# Patient Record
Sex: Male | Born: 1985 | Race: Black or African American | Hispanic: No | Marital: Single | Smoking: Current every day smoker
Health system: Southern US, Community
[De-identification: ages and names within clinical notes are randomized; demographics above are authoritative.]

## PROBLEM LIST (undated history)

## (undated) DIAGNOSIS — M419 Scoliosis, unspecified: Secondary | ICD-10-CM

## (undated) DIAGNOSIS — W3400XA Accidental discharge from unspecified firearms or gun, initial encounter: Secondary | ICD-10-CM

---
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
## 2018-12-30 ENCOUNTER — Other Ambulatory Visit: Payer: Self-pay

## 2018-12-30 ENCOUNTER — Emergency Department (HOSPITAL_COMMUNITY)
Admission: EM | Admit: 2018-12-30 | Discharge: 2018-12-30 | Disposition: A | Discharge status: Home / Self Care | Payer: Self-pay | Attending: Emergency Medicine | Admitting: Emergency Medicine

## 2018-12-30 ENCOUNTER — Encounter (HOSPITAL_COMMUNITY): Payer: Self-pay

## 2018-12-30 DIAGNOSIS — S39012A Strain of muscle, fascia and tendon of lower back, initial encounter: Secondary | ICD-10-CM

## 2018-12-30 DIAGNOSIS — K2900 Acute gastritis without bleeding: Secondary | ICD-10-CM

## 2018-12-30 DIAGNOSIS — Y999 Unspecified external cause status: Secondary | ICD-10-CM | POA: Insufficient documentation

## 2018-12-30 DIAGNOSIS — Y929 Unspecified place or not applicable: Secondary | ICD-10-CM | POA: Insufficient documentation

## 2018-12-30 DIAGNOSIS — F17210 Nicotine dependence, cigarettes, uncomplicated: Secondary | ICD-10-CM | POA: Insufficient documentation

## 2018-12-30 DIAGNOSIS — X58XXXA Exposure to other specified factors, initial encounter: Secondary | ICD-10-CM | POA: Insufficient documentation

## 2018-12-30 DIAGNOSIS — Y939 Activity, unspecified: Secondary | ICD-10-CM | POA: Insufficient documentation

## 2018-12-30 DIAGNOSIS — R300 Dysuria: Secondary | ICD-10-CM | POA: Insufficient documentation

## 2018-12-30 HISTORY — DX: Scoliosis, unspecified: M41.9

## 2018-12-30 HISTORY — DX: Accidental discharge from unspecified firearms or gun, initial encounter: W34.00XA

## 2018-12-30 LAB — CBC WITH DIFFERENTIAL/PLATELET
Abs Immature Granulocytes: 0.01 10*3/uL (ref 0.00–0.07)
Basophils Absolute: 0 10*3/uL (ref 0.0–0.1)
Basophils Relative: 1 %
Eosinophils Absolute: 0.1 10*3/uL (ref 0.0–0.5)
Eosinophils Relative: 1 %
HEMATOCRIT: 50.1 % (ref 39.0–52.0)
Hemoglobin: 15.7 g/dL (ref 13.0–17.0)
Immature Granulocytes: 0 %
LYMPHS ABS: 1.2 10*3/uL (ref 0.7–4.0)
Lymphocytes Relative: 19 %
MCH: 22.3 pg — ABNORMAL LOW (ref 26.0–34.0)
MCHC: 31.3 g/dL (ref 30.0–36.0)
MCV: 71.1 fL — ABNORMAL LOW (ref 80.0–100.0)
Monocytes Absolute: 0.9 10*3/uL (ref 0.1–1.0)
Monocytes Relative: 14 %
Neutro Abs: 4.2 10*3/uL (ref 1.7–7.7)
Neutrophils Relative %: 65 %
Platelets: 295 10*3/uL (ref 150–400)
RBC: 7.05 MIL/uL — ABNORMAL HIGH (ref 4.22–5.81)
RDW: 16.4 % — ABNORMAL HIGH (ref 11.5–15.5)
WBC: 6.5 10*3/uL (ref 4.0–10.5)
nRBC: 0 % (ref 0.0–0.2)

## 2018-12-30 LAB — COMPREHENSIVE METABOLIC PANEL
ALBUMIN: 4 g/dL (ref 3.5–5.0)
ALK PHOS: 68 U/L (ref 38–126)
ALT: 16 U/L (ref 0–44)
AST: 28 U/L (ref 15–41)
Anion gap: 10 (ref 5–15)
BUN: 8 mg/dL (ref 6–20)
CO2: 25 mmol/L (ref 22–32)
Calcium: 9.4 mg/dL (ref 8.9–10.3)
Chloride: 102 mmol/L (ref 98–111)
Creatinine, Ser: 1.07 mg/dL (ref 0.61–1.24)
GFR calc Af Amer: >60 mL/min (ref >60)
GFR calc non Af Amer: >60 mL/min (ref >60)
Glucose, Bld: 97 mg/dL (ref 70–99)
Potassium: 3.8 mmol/L (ref 3.5–5.1)
Sodium: 137 mmol/L (ref 135–145)
Total Bilirubin: 0.6 mg/dL (ref 0.3–1.2)
Total Protein: 7.4 g/dL (ref 6.5–8.1)

## 2018-12-30 LAB — URINALYSIS, ROUTINE W REFLEX MICROSCOPIC
Bilirubin Urine: NEGATIVE
Glucose, UA: NEGATIVE mg/dL
Hgb urine dipstick: NEGATIVE
KETONES UR: NEGATIVE mg/dL
Leukocytes,Ua: NEGATIVE
Nitrite: NEGATIVE
Protein, ur: NEGATIVE mg/dL
Specific Gravity, Urine: 1.02 (ref 1.005–1.030)
pH: 7 (ref 5.0–8.0)

## 2018-12-30 LAB — ACETAMINOPHEN LEVEL: Acetaminophen (Tylenol), Serum: <10 ug/mL — ABNORMAL LOW (ref 10–30)

## 2018-12-30 LAB — LIPASE, BLOOD: Lipase: 28 U/L (ref 11–51)

## 2018-12-30 MED ORDER — FAMOTIDINE IN NACL 20-0.9 MG/50ML-% IV SOLN
20.0000 mg | Freq: Once | INTRAVENOUS | Status: AC
  Administered 2018-12-30: 20 mg via INTRAVENOUS
  Filled 2018-12-30: qty 50

## 2018-12-30 MED ORDER — LIDOCAINE VISCOUS HCL 2 % MT SOLN
15.0000 mL | Freq: Once | OROMUCOSAL | Status: AC
  Administered 2018-12-30: 15 mL via ORAL
  Filled 2018-12-30: qty 15

## 2018-12-30 MED ORDER — CYCLOBENZAPRINE HCL 10 MG PO TABS: 10.0000 mg | ORAL_TABLET | Freq: Two times a day (BID) | ORAL | 0 refills | Status: DC | PRN

## 2018-12-30 MED ORDER — ALUM & MAG HYDROXIDE-SIMETH 200-200-20 MG/5ML PO SUSP
30.0000 mL | Freq: Once | ORAL | Status: AC
  Administered 2018-12-30: 30 mL via ORAL
  Filled 2018-12-30: qty 30

## 2018-12-30 MED ORDER — OMEPRAZOLE 20 MG PO CPDR: 20.0000 mg | DELAYED_RELEASE_CAPSULE | Freq: Two times a day (BID) | ORAL | 0 refills | Status: AC

## 2018-12-30 MED ORDER — IBUPROFEN 600 MG PO TABS: 600.0000 mg | ORAL_TABLET | Freq: Four times a day (QID) | ORAL | 0 refills | Status: AC | PRN
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
## 2018-12-30 NOTE — Discharge Instructions (Addendum)
Do not take naproxen for the next three days. You may start the ibuprofen in three days. Never take more acetaminophen than what is needed on the label. You can take the Flexeril as needed for muscle spasms. Start taking the omeprazole today for your stomach pain.

## 2018-12-30 NOTE — ED Notes (Signed)
ED Provider at bedside. 

## 2018-12-31 LAB — RPR: RPR Ser Ql: NONREACTIVE

## 2018-12-31 LAB — HIV ANTIBODY (ROUTINE TESTING W REFLEX): HIV Screen 4th Generation wRfx: NONREACTIVE

## 2019-01-01 ENCOUNTER — Other Ambulatory Visit: Payer: Self-pay

## 2019-01-01 ENCOUNTER — Emergency Department (HOSPITAL_COMMUNITY): Payer: Self-pay

## 2019-01-01 ENCOUNTER — Encounter (HOSPITAL_COMMUNITY): Payer: Self-pay | Admitting: Emergency Medicine

## 2019-01-01 ENCOUNTER — Emergency Department (HOSPITAL_COMMUNITY)
Admission: EM | Admit: 2019-01-01 | Discharge: 2019-01-01 | Disposition: A | Discharge status: Home / Self Care | Payer: Self-pay | Attending: Emergency Medicine | Admitting: Emergency Medicine

## 2019-01-01 DIAGNOSIS — R109 Unspecified abdominal pain: Secondary | ICD-10-CM

## 2019-01-01 DIAGNOSIS — Z79899 Other long term (current) drug therapy: Secondary | ICD-10-CM | POA: Insufficient documentation

## 2019-01-01 DIAGNOSIS — R197 Diarrhea, unspecified: Secondary | ICD-10-CM | POA: Insufficient documentation

## 2019-01-01 DIAGNOSIS — K920 Hematemesis: Secondary | ICD-10-CM | POA: Insufficient documentation

## 2019-01-01 DIAGNOSIS — F17210 Nicotine dependence, cigarettes, uncomplicated: Secondary | ICD-10-CM | POA: Insufficient documentation

## 2019-01-01 DIAGNOSIS — R1011 Right upper quadrant pain: Secondary | ICD-10-CM | POA: Insufficient documentation

## 2019-01-01 LAB — COMPREHENSIVE METABOLIC PANEL
ALT: 14 U/L (ref 0–44)
ANION GAP: 6 (ref 5–15)
AST: 24 U/L (ref 15–41)
Albumin: 3.6 g/dL (ref 3.5–5.0)
Alkaline Phosphatase: 60 U/L (ref 38–126)
BUN: 9 mg/dL (ref 6–20)
CO2: 27 mmol/L (ref 22–32)
Calcium: 9 mg/dL (ref 8.9–10.3)
Chloride: 104 mmol/L (ref 98–111)
Creatinine, Ser: 1.14 mg/dL (ref 0.61–1.24)
GFR calc Af Amer: >60 mL/min (ref >60)
GFR calc non Af Amer: >60 mL/min (ref >60)
Glucose, Bld: 95 mg/dL (ref 70–99)
Potassium: 3.7 mmol/L (ref 3.5–5.1)
Sodium: 137 mmol/L (ref 135–145)
Total Bilirubin: 0.6 mg/dL (ref 0.3–1.2)
Total Protein: 7.4 g/dL (ref 6.5–8.1)

## 2019-01-01 LAB — CBC
HCT: 47 % (ref 39.0–52.0)
Hemoglobin: 14.5 g/dL (ref 13.0–17.0)
MCH: 22.3 pg — ABNORMAL LOW (ref 26.0–34.0)
MCHC: 30.9 g/dL (ref 30.0–36.0)
MCV: 72.3 fL — ABNORMAL LOW (ref 80.0–100.0)
Platelets: 304 10*3/uL (ref 150–400)
RBC: 6.5 MIL/uL — ABNORMAL HIGH (ref 4.22–5.81)
RDW: 15.5 % (ref 11.5–15.5)
WBC: 5.6 10*3/uL (ref 4.0–10.5)
nRBC: 0 % (ref 0.0–0.2)

## 2019-01-01 LAB — URINALYSIS, ROUTINE W REFLEX MICROSCOPIC
Bilirubin Urine: NEGATIVE
Glucose, UA: NEGATIVE mg/dL
Hgb urine dipstick: NEGATIVE
Ketones, ur: NEGATIVE mg/dL
Leukocytes,Ua: NEGATIVE
NITRITE: NEGATIVE
Protein, ur: NEGATIVE mg/dL
Specific Gravity, Urine: 1.026 (ref 1.005–1.030)
pH: 6 (ref 5.0–8.0)

## 2019-01-01 LAB — LIPASE, BLOOD: Lipase: 25 U/L (ref 11–51)

## 2019-01-01 MED ORDER — SODIUM CHLORIDE 0.9 % IV SOLN
1000.0000 mL | INTRAVENOUS | Status: DC
  Administered 2019-01-01: 1000 mL via INTRAVENOUS

## 2019-01-01 MED ORDER — HYDROMORPHONE HCL 1 MG/ML IJ SOLN
1.0000 mg | Freq: Once | INTRAMUSCULAR | Status: AC
  Administered 2019-01-01: 1 mg via INTRAVENOUS
  Filled 2019-01-01: qty 1

## 2019-01-01 MED ORDER — ONDANSETRON 8 MG PO TBDP: 8.0000 mg | ORAL_TABLET | Freq: Three times a day (TID) | ORAL | 0 refills | Status: AC | PRN

## 2019-01-01 MED ORDER — IOHEXOL 300 MG/ML  SOLN
100.0000 mL | Freq: Once | INTRAMUSCULAR | Status: AC | PRN
  Administered 2019-01-01: 100 mL via INTRAVENOUS

## 2019-01-01 MED ORDER — FAMOTIDINE 20 MG PO TABS: 20.0000 mg | ORAL_TABLET | Freq: Two times a day (BID) | ORAL | 0 refills | Status: AC

## 2019-01-01 MED ORDER — SODIUM CHLORIDE 0.9% FLUSH
3.0000 mL | Freq: Once | INTRAVENOUS | Status: DC
  Administered 2019-01-01: 3 mL via INTRAVENOUS

## 2019-01-01 MED ORDER — SODIUM CHLORIDE 0.9 % IV BOLUS (SEPSIS)
1000.0000 mL | Freq: Once | INTRAVENOUS | Status: AC
  Administered 2019-01-01: 1000 mL via INTRAVENOUS

## 2019-01-01 MED ORDER — ONDANSETRON HCL 4 MG/2ML IJ SOLN
4.0000 mg | Freq: Once | INTRAMUSCULAR | Status: AC
  Administered 2019-01-01: 4 mg via INTRAVENOUS
  Filled 2019-01-01: qty 2

## 2019-01-01 MED ORDER — DICYCLOMINE HCL 20 MG PO TABS: 20.0000 mg | ORAL_TABLET | Freq: Two times a day (BID) | ORAL | 0 refills | Status: AC

## 2019-01-01 MED ORDER — SODIUM CHLORIDE 0.9 % IV BOLUS (SEPSIS): 1000.0000 mL | Freq: Once | INTRAVENOUS | Status: DC

## 2019-01-01 NOTE — ED Notes (Signed)
Patient verbalizes understanding of discharge instructions . Opportunity for questions and answers were provided . Armband removed by staff ,Pt discharged from ED. W/C  offered at D/C  and Declined W/C at D/C and was escorted to lobby by RN.  
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
## 2019-01-01 NOTE — Discharge Instructions (Addendum)
Take the medications as prescribed, follow-up with your primary care doctor to be rechecked next week, return as needed for worsening symptoms

## 2019-01-01 NOTE — ED Triage Notes (Signed)
Pt c/o abdominal pain with nausea/vomitng and diarrhea. Pt reports taking ibuprofen and "6 pills for pain that I got from [facility]" with no relief.

## 2019-01-02 LAB — GC/CHLAMYDIA PROBE AMP (~~LOC~~) NOT AT ARMC
Chlamydia: NEGATIVE
Neisseria Gonorrhea: NEGATIVE

## 2019-05-25 ENCOUNTER — Encounter (HOSPITAL_COMMUNITY): Payer: Self-pay

## 2019-05-25 ENCOUNTER — Other Ambulatory Visit: Payer: Self-pay

## 2019-05-25 ENCOUNTER — Emergency Department (HOSPITAL_COMMUNITY): Payer: Self-pay

## 2019-05-25 ENCOUNTER — Emergency Department (HOSPITAL_COMMUNITY)
Admission: EM | Admit: 2019-05-25 | Discharge: 2019-05-25 | Disposition: A | Discharge status: Home / Self Care | Payer: Self-pay | Attending: Emergency Medicine | Admitting: Emergency Medicine

## 2019-05-25 DIAGNOSIS — F17210 Nicotine dependence, cigarettes, uncomplicated: Secondary | ICD-10-CM | POA: Insufficient documentation

## 2019-05-25 DIAGNOSIS — R202 Paresthesia of skin: Secondary | ICD-10-CM | POA: Insufficient documentation

## 2019-05-25 DIAGNOSIS — Z79899 Other long term (current) drug therapy: Secondary | ICD-10-CM | POA: Insufficient documentation

## 2019-05-25 MED ORDER — OXYCODONE-ACETAMINOPHEN 5-325 MG PO TABS
1.0000 | ORAL_TABLET | ORAL | Status: DC | PRN
  Administered 2019-05-25: 1 via ORAL
  Filled 2019-05-25: qty 1

## 2019-05-25 MED ORDER — KETOROLAC TROMETHAMINE 60 MG/2ML IM SOLN
15.0000 mg | Freq: Once | INTRAMUSCULAR | Status: AC
  Administered 2019-05-25: 15 mg via INTRAMUSCULAR
  Filled 2019-05-25: qty 2

## 2019-05-25 MED ORDER — OXYCODONE HCL 5 MG PO TABS
5.0000 mg | ORAL_TABLET | Freq: Once | ORAL | Status: AC
  Administered 2019-05-25: 5 mg via ORAL
  Filled 2019-05-25: qty 1
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
## 2019-05-25 NOTE — ED Triage Notes (Signed)
Patient reports that he is "getting like a shock in his left leg today" Patient states he also has stinging and burning in the left leg. Patient denies any injury to the left leg.

## 2019-05-25 NOTE — Discharge Instructions (Signed)
There was no issue of your hardware or bones on your x-ray.  Please follow-up with the family doctor and a neurologist.  Take 4 over the counter ibuprofen tablets 3 times a day or 2 over-the-counter naproxen tablets twice a day for pain. Also take tylenol 1000mg (2 extra strength) four times a day.

## 2019-05-26 ENCOUNTER — Telehealth: Payer: Self-pay | Admitting: General Practice

## 2019-05-26 NOTE — Telephone Encounter (Signed)
Tried to call pt based on CRM we received, per note: pt would like appt to establish care, also diarrhea issue and leg issue. Pt wife said she would have him call back

## 2020-02-15 ENCOUNTER — Telehealth: Payer: Self-pay | Admitting: *Deleted

## 2020-02-15 ENCOUNTER — Ambulatory Visit: Payer: Self-pay | Attending: Internal Medicine

## 2020-02-15 DIAGNOSIS — Z23 Encounter for immunization: Secondary | ICD-10-CM

## 2020-02-15 NOTE — Progress Notes (Signed)
   Covid-19 Vaccination Clinic  Name:  Terry Evans    MRN: [id number] DOB: Jul 01, 1986  02/15/2020  [name] was observed post Covid-19 immunization for 15 minutes without incident. He was provided with Vaccine Information Sheet and instruction to access the V-Safe system.   [name] was instructed to call 911 with any severe reactions post vaccine: Marland Kitchen Difficulty breathing  . Swelling of face and throat  . A fast heartbeat  . A bad rash all over body  . Dizziness and weakness   Immunizations Administered    Name Date Dose VIS Date Route   Pfizer COVID-19 Vaccine 02/15/2020 12:03 PM 0.3 mL 12/20/2018 Intramuscular   Manufacturer: [employer], [employer]   Lot: [id number]   NDC: [address]-8

## 2020-02-15 NOTE — Telephone Encounter (Signed)
Patient calling for general information about the COVID vaccine: SE, how it works, what happens if I get COVID after- questions answered to patient satisfaction

## 2020-03-11 ENCOUNTER — Ambulatory Visit: Payer: Self-pay | Attending: Internal Medicine

## 2020-03-11 DIAGNOSIS — Z23 Encounter for immunization: Secondary | ICD-10-CM

## 2020-03-11 NOTE — Progress Notes (Signed)
   Covid-19 Vaccination Clinic  Name:  Terry Evans    MRN: [id number] DOB: [birth year]  03/11/2020  Mr. Groleau was observed post Covid-19 immunization for 15 minutes without incident. He was provided with Vaccine Information Sheet and instruction to access the V-Safe system.   Mr. Langworthy was instructed to call 911 with any severe reactions post vaccine: Marland Kitchen Difficulty breathing  . Swelling of face and throat  . A fast heartbeat  . A bad rash all over body  . Dizziness and weakness   Immunizations Administered    Name Date Dose VIS Date Route   Pfizer COVID-19 Vaccine 03/11/2020 11:46 AM 0.3 mL 12/20/2018 Intramuscular   Manufacturer: [employer], [employer]   Lot: [id number]   NDC: [address]-4

## 2020-03-16 ENCOUNTER — Emergency Department (HOSPITAL_COMMUNITY)
Admission: EM | Admit: 2020-03-16 | Discharge: 2020-03-16 | Disposition: A | Discharge status: Home / Self Care | Payer: Self-pay | Attending: Emergency Medicine | Admitting: Emergency Medicine

## 2020-03-16 ENCOUNTER — Emergency Department (HOSPITAL_COMMUNITY): Payer: Self-pay

## 2020-03-16 ENCOUNTER — Other Ambulatory Visit: Payer: Self-pay

## 2020-03-16 DIAGNOSIS — F17210 Nicotine dependence, cigarettes, uncomplicated: Secondary | ICD-10-CM | POA: Insufficient documentation

## 2020-03-16 DIAGNOSIS — M25531 Pain in right wrist: Secondary | ICD-10-CM | POA: Insufficient documentation

## 2020-03-16 MED ORDER — IBUPROFEN 200 MG PO TABS
600.0000 mg | ORAL_TABLET | Freq: Once | ORAL | Status: AC
  Administered 2020-03-16: 600 mg via ORAL
  Filled 2020-03-16: qty 3

## 2020-03-16 NOTE — ED Triage Notes (Signed)
Patient reports swelling in anterior right wrist. Started yesterday. Denies injury. States he cannot move wrist. Pain is 3/10
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
## 2020-03-16 NOTE — Discharge Instructions (Addendum)

## 2020-04-06 ENCOUNTER — Other Ambulatory Visit: Payer: Self-pay

## 2020-04-06 ENCOUNTER — Emergency Department (HOSPITAL_COMMUNITY)
Admission: EM | Admit: 2020-04-06 | Discharge: 2020-04-06 | Disposition: A | Discharge status: Home / Self Care | Payer: No Typology Code available for payment source | Attending: Emergency Medicine | Admitting: Emergency Medicine

## 2020-04-06 ENCOUNTER — Encounter (HOSPITAL_COMMUNITY): Payer: Self-pay

## 2020-04-06 DIAGNOSIS — X58XXXA Exposure to other specified factors, initial encounter: Secondary | ICD-10-CM | POA: Insufficient documentation

## 2020-04-06 DIAGNOSIS — Z79899 Other long term (current) drug therapy: Secondary | ICD-10-CM | POA: Diagnosis not present

## 2020-04-06 DIAGNOSIS — Y999 Unspecified external cause status: Secondary | ICD-10-CM | POA: Diagnosis not present

## 2020-04-06 DIAGNOSIS — Y9389 Activity, other specified: Secondary | ICD-10-CM | POA: Diagnosis not present

## 2020-04-06 DIAGNOSIS — S61214A Laceration without foreign body of right ring finger without damage to nail, initial encounter: Secondary | ICD-10-CM | POA: Insufficient documentation

## 2020-04-06 DIAGNOSIS — Z23 Encounter for immunization: Secondary | ICD-10-CM | POA: Diagnosis not present

## 2020-04-06 DIAGNOSIS — Y9269 Other specified industrial and construction area as the place of occurrence of the external cause: Secondary | ICD-10-CM | POA: Insufficient documentation

## 2020-04-06 DIAGNOSIS — F17210 Nicotine dependence, cigarettes, uncomplicated: Secondary | ICD-10-CM | POA: Insufficient documentation

## 2020-04-06 MED ORDER — CEPHALEXIN 500 MG PO CAPS: 500.0000 mg | ORAL_CAPSULE | Freq: Three times a day (TID) | ORAL | 0 refills | Status: AC

## 2020-04-06 MED ORDER — TETANUS-DIPHTH-ACELL PERTUSSIS 5-2.5-18.5 LF-MCG/0.5 IM SUSP
0.5000 mL | Freq: Once | INTRAMUSCULAR | Status: AC
  Administered 2020-04-06: 0.5 mL via INTRAMUSCULAR
  Filled 2020-04-06: qty 0.5

## 2020-04-06 MED ORDER — KETOROLAC TROMETHAMINE 30 MG/ML IJ SOLN
30.0000 mg | Freq: Once | INTRAMUSCULAR | Status: AC
  Administered 2020-04-06: 30 mg via INTRAMUSCULAR
  Filled 2020-04-06: qty 1

## 2020-04-06 NOTE — ED Notes (Signed)
Cleansed laceration with saline and betadine.

## 2020-04-06 NOTE — Discharge Instructions (Addendum)
WOUND CARE  Keep area clean and dry for 24 hours. Do not remove bandage, if applied. Take antibiotics as directed, please come back to the emergency department for reactions to these.  Take on a full stomach.  After 24 hours, remove bandage and wash wound gently with mild soap and warm water. Reapply a new bandage after cleaning wound, if directed.  Continue daily cleansing with soap and water until stitches/staples are removed.  Do not apply any ointments or creams to the wound while stitches/staples are in place, as this may cause delayed healing.  Notify the office if you experience any of the following signs of infection: Swelling, redness, pus drainage, streaking, fever >101.0 F  Notify the office if you experience excessive bleeding that does not stop after 15-20 minutes of constant, firm pressure.
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
## 2020-04-06 NOTE — ED Triage Notes (Signed)
Patient c/o right index finger laceration that happened yesterday. 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 Fright index finger still having  A small amount bleeding at this time.

## 2020-04-07 ENCOUNTER — Encounter (HOSPITAL_COMMUNITY): Payer: Self-pay | Admitting: Emergency Medicine

## 2020-04-07 ENCOUNTER — Emergency Department (HOSPITAL_COMMUNITY)
Admission: EM | Admit: 2020-04-07 | Discharge: 2020-04-07 | Disposition: A | Discharge status: Home / Self Care | Payer: Self-pay | Attending: Emergency Medicine | Admitting: Emergency Medicine

## 2020-04-07 DIAGNOSIS — Z760 Encounter for issue of repeat prescription: Secondary | ICD-10-CM | POA: Insufficient documentation

## 2020-04-07 DIAGNOSIS — S61214D Laceration without foreign body of right ring finger without damage to nail, subsequent encounter: Secondary | ICD-10-CM | POA: Insufficient documentation

## 2020-04-07 DIAGNOSIS — Z5321 Procedure and treatment not carried out due to patient leaving prior to being seen by health care provider: Secondary | ICD-10-CM | POA: Insufficient documentation

## 2020-04-07 DIAGNOSIS — X58XXXD Exposure to other specified factors, subsequent encounter: Secondary | ICD-10-CM | POA: Insufficient documentation

## 2020-04-07 NOTE — ED Triage Notes (Signed)
Pt reports he was here yesterday for a laceration on right 4th finger. Reports he was given a prescription for antibiotics but denies given any prescription for pain and finger in severe pain.

## 2020-05-27 ENCOUNTER — Encounter (HOSPITAL_COMMUNITY): Payer: Self-pay

## 2020-05-27 ENCOUNTER — Emergency Department (HOSPITAL_COMMUNITY): Payer: Self-pay

## 2020-05-27 ENCOUNTER — Emergency Department (HOSPITAL_COMMUNITY)
Admission: EM | Admit: 2020-05-27 | Discharge: 2020-05-27 | Disposition: A | Discharge status: Home / Self Care | Payer: Self-pay | Attending: Emergency Medicine | Admitting: Emergency Medicine

## 2020-05-27 ENCOUNTER — Other Ambulatory Visit: Payer: Self-pay

## 2020-05-27 DIAGNOSIS — B349 Viral infection, unspecified: Secondary | ICD-10-CM | POA: Insufficient documentation

## 2020-05-27 DIAGNOSIS — R067 Sneezing: Secondary | ICD-10-CM | POA: Insufficient documentation

## 2020-05-27 DIAGNOSIS — F17210 Nicotine dependence, cigarettes, uncomplicated: Secondary | ICD-10-CM | POA: Insufficient documentation

## 2020-05-27 DIAGNOSIS — M791 Myalgia, unspecified site: Secondary | ICD-10-CM | POA: Insufficient documentation

## 2020-05-27 DIAGNOSIS — R05 Cough: Secondary | ICD-10-CM | POA: Insufficient documentation

## 2020-05-27 MED ORDER — IBUPROFEN 200 MG PO TABS
400.0000 mg | ORAL_TABLET | Freq: Once | ORAL | Status: AC
  Administered 2020-05-27: 400 mg via ORAL
  Filled 2020-05-27: qty 2

## 2020-05-27 NOTE — ED Notes (Signed)
Multiple attempts made to swab the patient for Covid 19. Patient did not tolerate well. Patient pulled swab away from his nose, swung his arms hitting staff and slid down in the seat away from the swab.

## 2020-05-27 NOTE — Discharge Instructions (Addendum)
Take Motrin or Tylenol as needed for pain or fever.  Use Robitussin-DM if needed for cough.  Follow-up with your doctor if you are not better in 4 to 5 days.
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
## 2020-05-27 NOTE — ED Triage Notes (Signed)
Patient states he was exposed to Covid a week ago. Patient states he has body aches, heart burn, and a congestion x 1 week.

## 2020-11-04 IMAGING — CR DG CHEST 2V
2 series · 2 of 2 positions shown · non-contrast
Comparison: January 01, 2019

CLINICAL DATA: Congestion, COVID exposure

EXAM:
CHEST - 2 VIEW

[w chest pa]
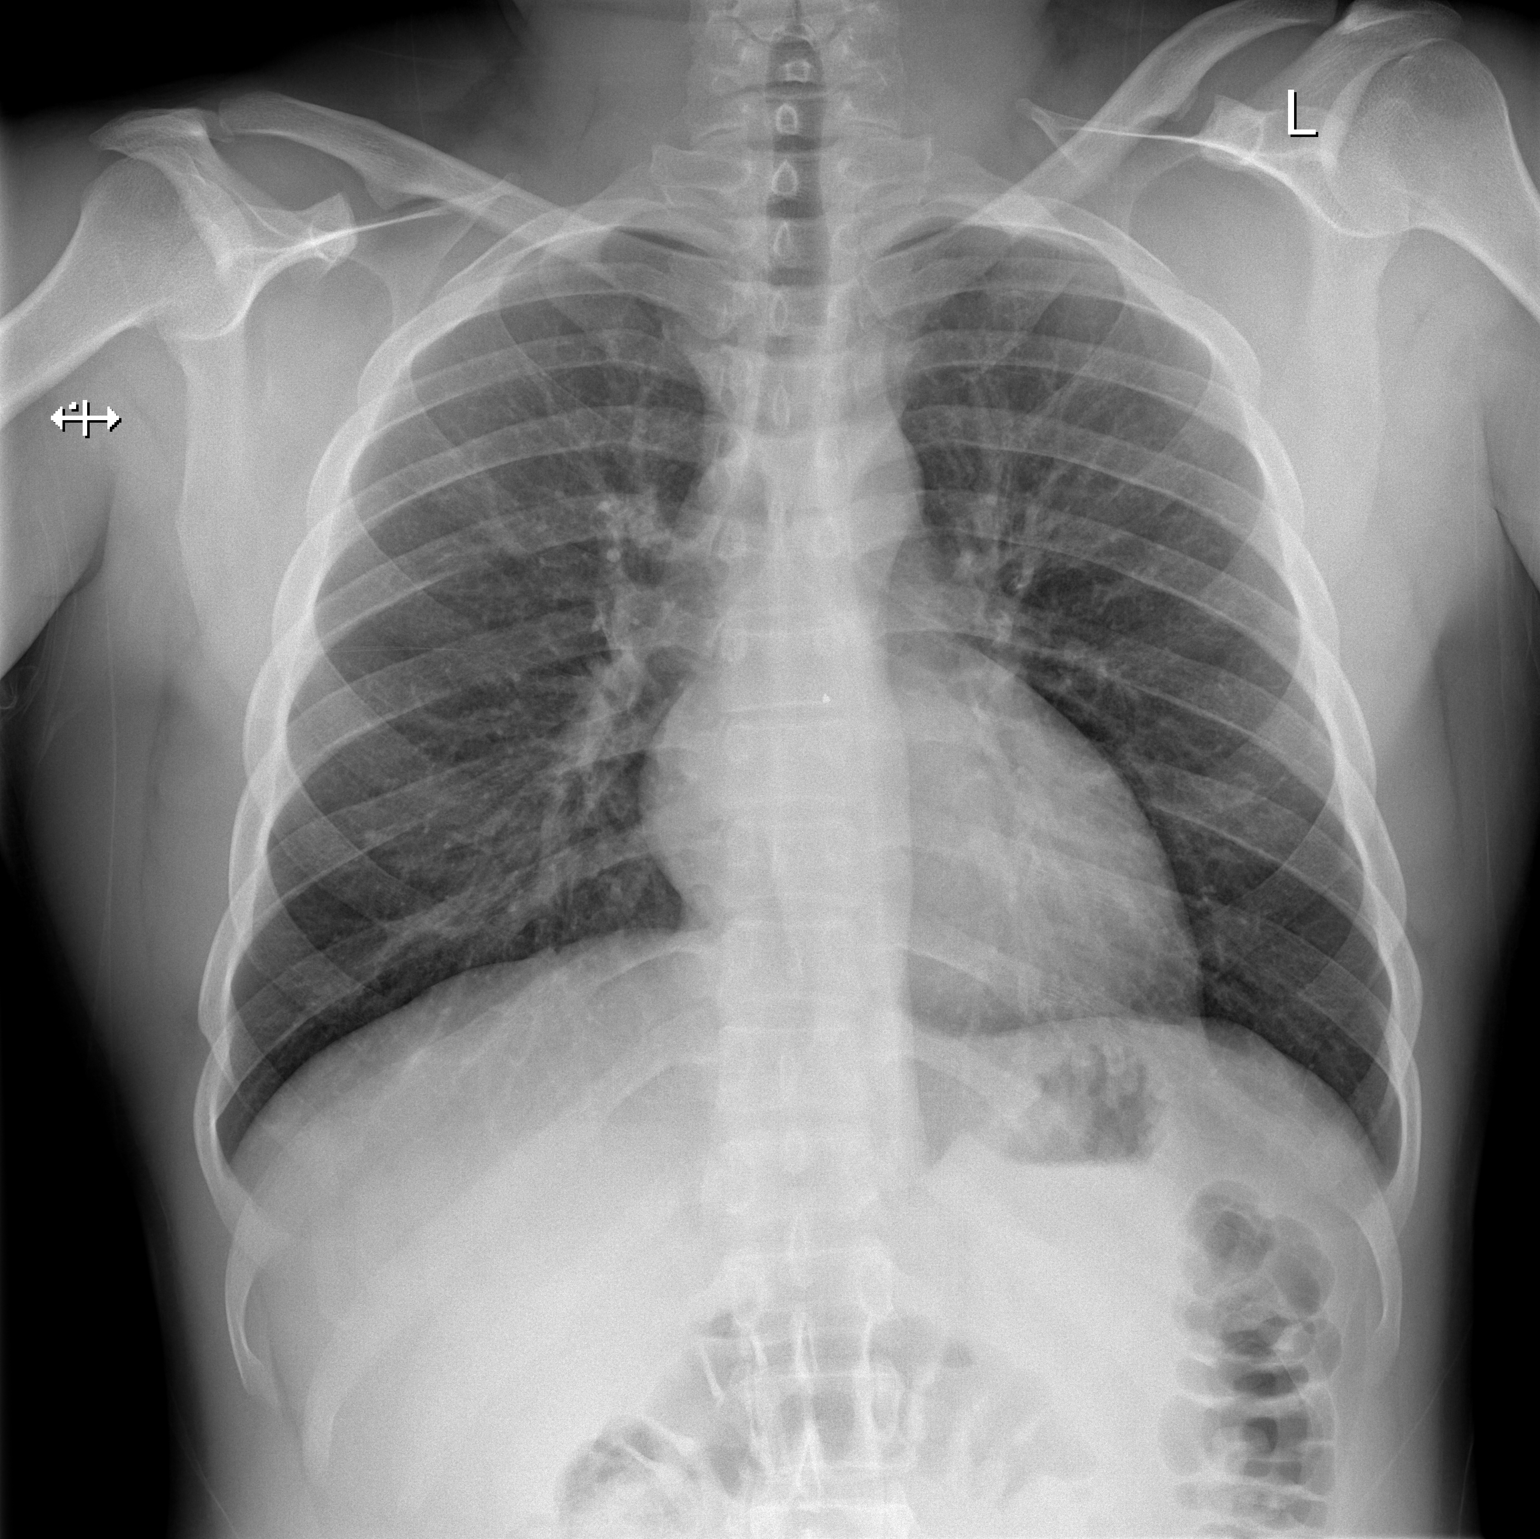

[w chest lat]
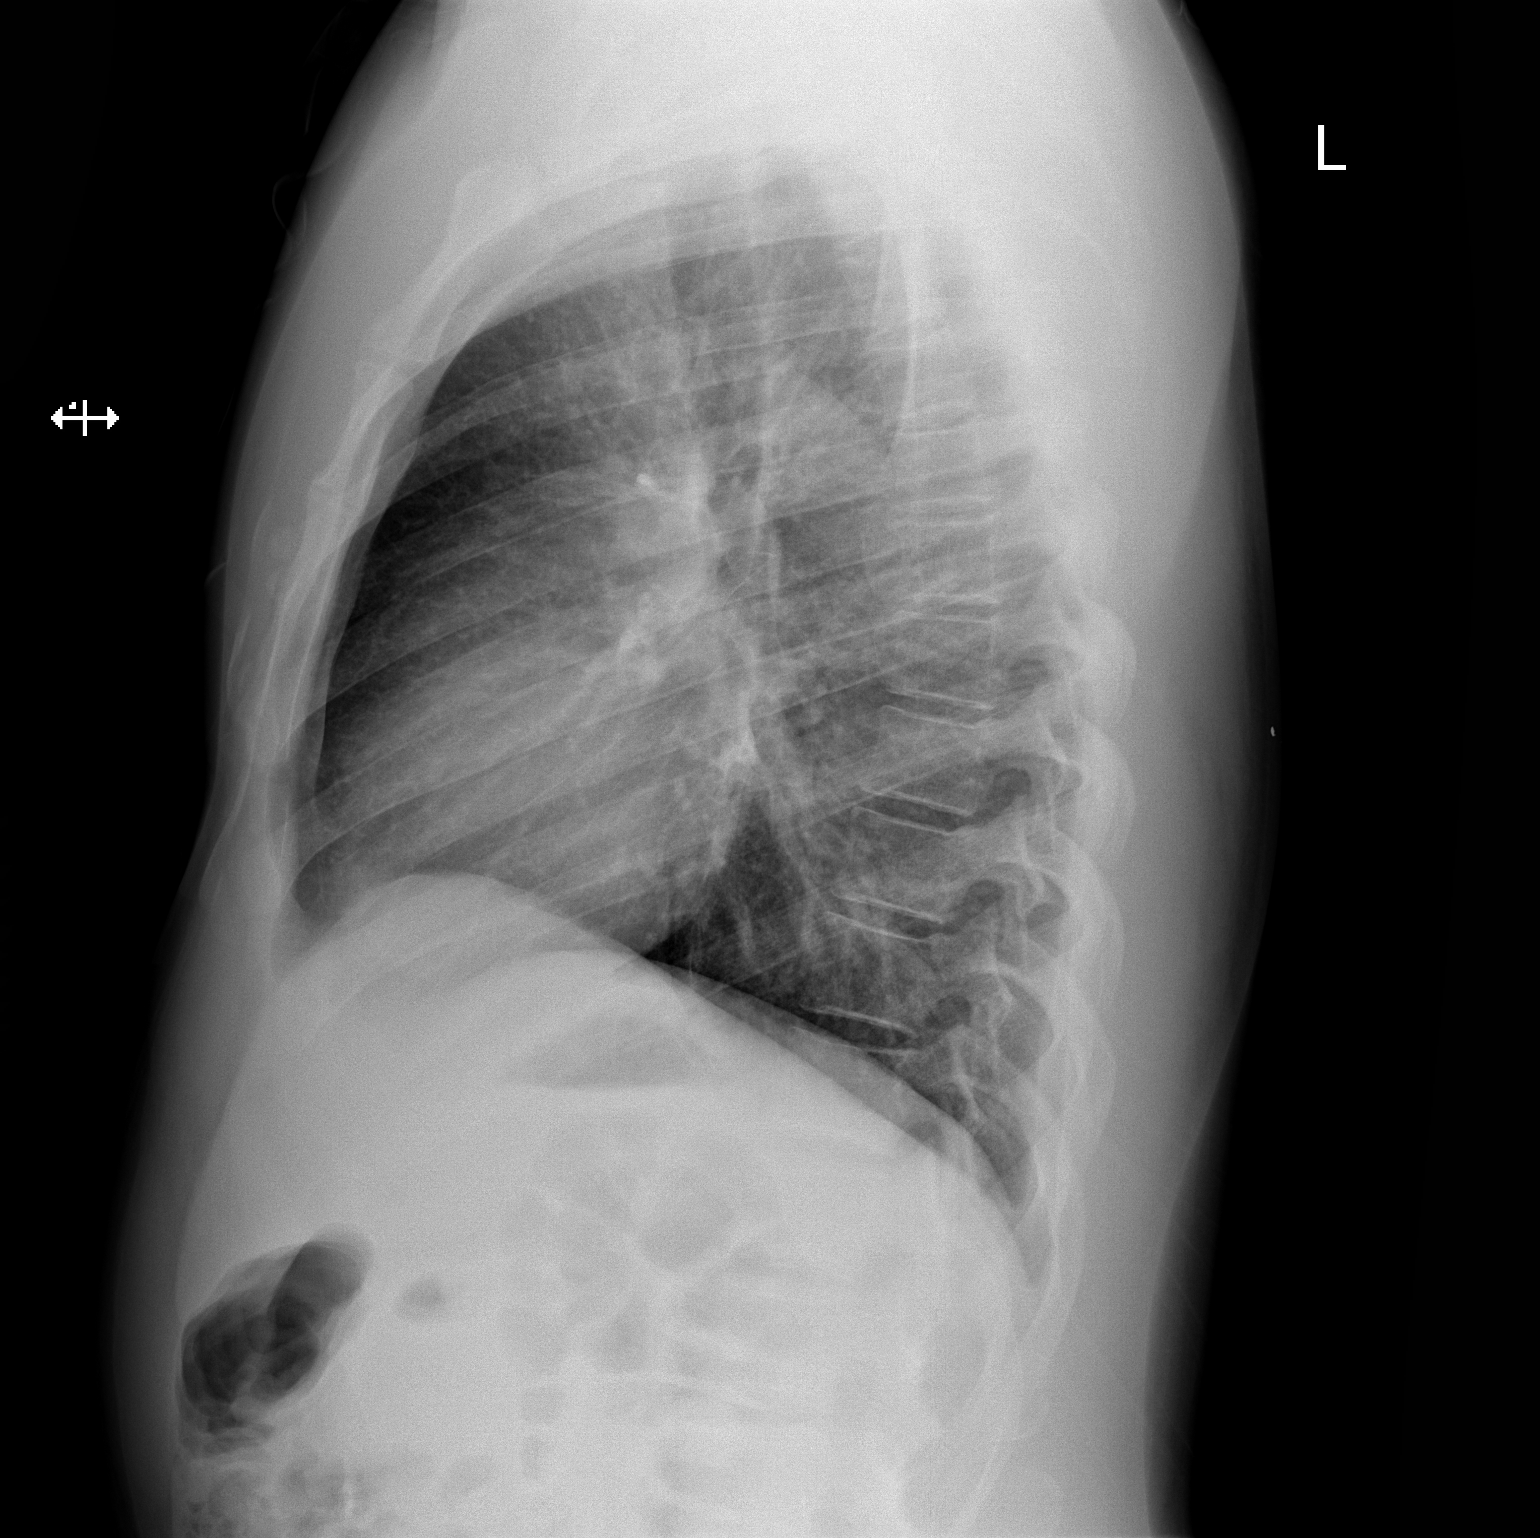

[2 of 2 positions shown; findings below may reference images not displayed]

FINDINGS: The cardiomediastinal silhouette is normal in contour. No pleural
effusion. No pneumothorax. No acute pleuroparenchymal abnormality.
Visualized abdomen is unremarkable. No acute osseous abnormality
noted. Unchanged metallic fleck of the posterior back superficial
soft tissues overlying the spine consistent with a retained foreign
body.
IMPRESSION: No acute cardiopulmonary abnormality.

## 2022-07-27 ENCOUNTER — Emergency Department (HOSPITAL_COMMUNITY)
Admission: EM | Admit: 2022-07-27 | Discharge: 2022-07-27 | Discharge status: Against Medical Advice | Payer: BC Managed Care – PPO | Attending: Student | Admitting: Student

## 2022-07-27 ENCOUNTER — Encounter (HOSPITAL_COMMUNITY): Payer: Self-pay

## 2022-07-27 ENCOUNTER — Other Ambulatory Visit: Payer: Self-pay

## 2022-07-27 DIAGNOSIS — R300 Dysuria: Secondary | ICD-10-CM | POA: Insufficient documentation

## 2022-07-27 DIAGNOSIS — Z5321 Procedure and treatment not carried out due to patient leaving prior to being seen by health care provider: Secondary | ICD-10-CM | POA: Diagnosis not present

## 2022-07-27 DIAGNOSIS — M545 Low back pain, unspecified: Secondary | ICD-10-CM | POA: Insufficient documentation

## 2022-07-27 LAB — URINALYSIS, ROUTINE W REFLEX MICROSCOPIC
Bilirubin Urine: NEGATIVE
Glucose, UA: NEGATIVE mg/dL
Hgb urine dipstick: NEGATIVE
Ketones, ur: NEGATIVE mg/dL
Leukocytes,Ua: NEGATIVE
Nitrite: NEGATIVE
Protein, ur: NEGATIVE mg/dL
Specific Gravity, Urine: 1.012 (ref 1.005–1.030)
pH: 7 (ref 5.0–8.0)

## 2022-07-27 MED ORDER — PREDNISONE 10 MG PO TABS: 20.0000 mg | ORAL_TABLET | Freq: Every day | ORAL | 0 refills | Status: DC

## 2022-07-27 MED ORDER — CYCLOBENZAPRINE HCL 10 MG PO TABS: 10.0000 mg | ORAL_TABLET | Freq: Two times a day (BID) | ORAL | 0 refills | Status: DC | PRN

## 2022-07-27 NOTE — ED Provider Triage Note (Signed)
Emergency Medicine Provider Triage Evaluation Note  Dariyon Urquilla , a 36 y.o. male  was evaluated in triage.  Pt complains of back pain going on for last couple days does not rating down his legs, also endorsed urinary dysuria intermittently has none at this time..  Review of Systems  Positive: Back pain, dysuria Negative: Urinary or bowel incontinency  Physical Exam  BP 131/78 (BP Location: Right Arm)   Pulse 91   Temp 98.5 F (36.9 C) (Oral)   Resp 16   SpO2 99%  Gen:   Awake, no distress   Resp:  Normal effort  MSK:   Moves extremities without difficulty  Other:    Medical Decision Making  Medically screening exam initiated at 7:25 PM.  Appropriate orders placed.  Thaine Difatta was informed that the remainder of the evaluation will be completed by another provider, this initial triage assessment does not replace that evaluation, and the importance of remaining in the ED until their evaluation is complete.  Lab work has been ordered will need further work-up.   Marcello Fennel, PA-C 07/27/22 1925

## 2022-07-27 NOTE — ED Triage Notes (Signed)
Patient complains of lower back pain x 1 day, denies any known injury. Denies dysuria. Pain only with rom

## 2022-07-27 NOTE — ED Notes (Signed)
Left prior to discharge instructions from lobby

## 2022-07-28 LAB — GC/CHLAMYDIA PROBE AMP (~~LOC~~) NOT AT ARMC
Chlamydia: NEGATIVE
Comment: NEGATIVE
Comment: NORMAL
Neisseria Gonorrhea: NEGATIVE

## 2023-11-03 ENCOUNTER — Emergency Department (HOSPITAL_COMMUNITY)
Admission: EM | Admit: 2023-11-03 | Discharge: 2023-11-03 | Discharge status: Against Medical Advice | Payer: Self-pay | Attending: Emergency Medicine | Admitting: Emergency Medicine

## 2023-11-03 ENCOUNTER — Emergency Department (HOSPITAL_COMMUNITY): Payer: BC Managed Care – PPO

## 2023-11-03 DIAGNOSIS — Z5321 Procedure and treatment not carried out due to patient leaving prior to being seen by health care provider: Secondary | ICD-10-CM | POA: Insufficient documentation

## 2023-11-03 DIAGNOSIS — M542 Cervicalgia: Secondary | ICD-10-CM | POA: Insufficient documentation

## 2023-11-03 NOTE — ED Triage Notes (Signed)
 Pt c/o neck pain for one month. Denies known trauma. Pt states pain is only when he moves in certain positions. Denies weakness/paresthesias.

## 2023-11-03 NOTE — ED Provider Triage Note (Signed)
 Emergency Medicine Provider Triage Evaluation Note  Brit Wernette , a 38 y.o. male  was evaluated in triage.  Pt complains of neck pain ongoing for about a month.  Denies trauma but states his range of motion is significantly limited and this is unusual.  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  Review of Systems  Positive: As above Negative: As above  Physical Exam  There were no vitals taken for this visit. Gen:   Awake, no distress   Resp:  Normal effort  MSK:   Moves extremities without difficulty  Other:    Medical Decision Making  Medically screening exam initiated at 12:30 PM.  Appropriate orders placed.  Candler Rochin was informed that the remainder of the evaluation will be completed by another provider, this initial triage assessment does not replace that evaluation, and the importance of remaining in the ED until their evaluation is complete.    Hildegard Loge, PA-C 11/03/23 1230

## 2023-11-03 NOTE — ED Notes (Signed)
Name called, no answer.

## 2023-11-03 NOTE — ED Notes (Signed)
 Patient left AMA.
